# Patient Record
Sex: Male | Born: 1965 | Race: White | Hispanic: No | Marital: Married | Smoking: Never smoker
Health system: Southern US, Community
[De-identification: ages and names within clinical notes are randomized; demographics above are authoritative.]

---

## 2013-03-04 ENCOUNTER — Emergency Department (HOSPITAL_COMMUNITY)
Admission: EM | Admit: 2013-03-04 | Discharge: 2013-03-05 | Disposition: A | Discharge status: Home / Self Care | Payer: BC Managed Care – PPO | Attending: Emergency Medicine | Admitting: Emergency Medicine

## 2013-03-04 ENCOUNTER — Emergency Department (HOSPITAL_COMMUNITY): Payer: BC Managed Care – PPO

## 2013-03-04 ENCOUNTER — Encounter (HOSPITAL_COMMUNITY): Payer: Self-pay | Admitting: Emergency Medicine

## 2013-03-04 DIAGNOSIS — W010XXA Fall on same level from slipping, tripping and stumbling without subsequent striking against object, initial encounter: Secondary | ICD-10-CM | POA: Insufficient documentation

## 2013-03-04 DIAGNOSIS — Y929 Unspecified place or not applicable: Secondary | ICD-10-CM | POA: Insufficient documentation

## 2013-03-04 DIAGNOSIS — Y9301 Activity, walking, marching and hiking: Secondary | ICD-10-CM | POA: Insufficient documentation

## 2013-03-04 DIAGNOSIS — Z880 Allergy status to penicillin: Secondary | ICD-10-CM | POA: Insufficient documentation

## 2013-03-04 DIAGNOSIS — S43102A Unspecified dislocation of left acromioclavicular joint, initial encounter: Secondary | ICD-10-CM

## 2013-03-04 DIAGNOSIS — S43109A Unspecified dislocation of unspecified acromioclavicular joint, initial encounter: Secondary | ICD-10-CM | POA: Insufficient documentation

## 2013-03-04 NOTE — ED Notes (Signed)
Pt was walking and stumbled off the porch hitting his left shoulder on the ground. Shoulder has a contusion and pt applied ice. ROM WNL but shoulder is painful.

## 2013-03-05 MED ORDER — HYDROCODONE-ACETAMINOPHEN 5-325 MG PO TABS: ORAL_TABLET | ORAL | Status: DC

## 2013-03-05 MED ORDER — IBUPROFEN 800 MG PO TABS
800.0000 mg | ORAL_TABLET | Freq: Once | ORAL | Status: AC
  Administered 2013-03-05: 800 mg via ORAL
  Filled 2013-03-05: qty 1

## 2013-03-05 MED ORDER — HYDROCODONE-ACETAMINOPHEN 5-325 MG PO TABS
1.0000 | ORAL_TABLET | Freq: Once | ORAL | Status: AC
  Administered 2013-03-05: 1 via ORAL
  Filled 2013-03-05: qty 1

## 2013-03-05 NOTE — ED Provider Notes (Signed)
History    CSN: [id number] Arrival date & time 03/04/13  2318  First MD Initiated Contact with Patient 03/04/13 2347     Chief Complaint  Patient presents with  . Shoulder Injury   (Consider location/radiation/quality/duration/timing/severity/associated sxs/prior Treatment) HPI  Evan Schwartz is a 47 y.o. male complaining of left posterior shoulder pain after slip and fall while patient was sitting on the porch this evening. Patient denies head trauma, LOC, headache, nausea vomiting, cervicalgia, numbness, paresthesia, chest pain, shortness of breath abdominal pain difficulty ambulating. He reports the pain at  History reviewed. No pertinent past medical history. History reviewed. No pertinent past surgical history. History reviewed. No pertinent family history. History  Substance Use Topics  . Smoking status: Never Smoker   . Smokeless tobacco: Never Used  . Alcohol Use: Yes     Comment: socially    Review of Systems  Constitutional:       Negative except as described in HPI  HENT:       Negative except as described in HPI  Respiratory:       Negative except as described in HPI  Cardiovascular:       Negative except as described in HPI  Gastrointestinal:       Negative except as described in HPI  Genitourinary:       Negative except as described in HPI  Musculoskeletal:       Negative except as described in HPI  Skin:       Negative except as described in HPI  Neurological:       Negative except as described in HPI  All other systems reviewed and are negative.    Allergies  Penicillins  Home Medications  No current outpatient prescriptions on file. BP 112/69  Pulse 84  Temp(Src) 98.1 F (36.7 C) (Oral)  Resp 15  Ht 6\' 1"  (1.854 m)  Wt 168 lb (76.204 kg)  BMI 22.17 kg/m2  SpO2 98% Physical Exam  Nursing note and vitals reviewed. Constitutional: He is oriented to person, place, and time. He appears well-developed and well-nourished. No distress.   HENT:  Head: Normocephalic and atraumatic.  Mouth/Throat: Oropharynx is clear and moist.  Eyes: Conjunctivae and EOM are normal. Pupils are equal, round, and reactive to light.  Neck: Normal range of motion.  No midline tenderness to palpation or step-offs appreciated. Patient has full range of motion without pain.   Cardiovascular: Normal rate, regular rhythm and intact distal pulses.   Pulmonary/Chest: Effort normal and breath sounds normal. No stridor. No respiratory distress. He has no wheezes. He has no rales. He exhibits no tenderness.  Abdominal: Soft. Bowel sounds are normal. He exhibits no distension and no mass. There is no tenderness. There is no rebound and no guarding.  Musculoskeletal: He exhibits tenderness. He exhibits no edema.  No clavicular deformities, does not appear to be dislocated. Can abduct greater than 90, drop arm is negative, there is no tenderness to palpation along the rotator cuff musculature. Neurovascularly intact. Elbow shows no abnormalities patient can fully extend, there is no snuffbox tenderness.  Neurological: He is alert and oriented to person, place, and time.  Psychiatric: He has a normal mood and affect.    ED Course  Procedures (including critical care time) Labs Reviewed - No data to display Dg Shoulder Left  03/05/2013   *RADIOLOGY REPORT*  Clinical Data: Larey Seat from [employer].  Pain and stiffness in the left shoulder.  Raised area over the distal clavicle.  LEFT SHOULDER - 2+ VIEW  Comparison: None.  Findings: There is grade II acromioclavicular separation consistent with ligamentous injury.  No acute fractures are demonstrated.  No dislocation of the glenohumeral joint.  No focal bone lesion or bone destruction.  Bone cortex and trabecular architecture appear intact.  IMPRESSION: Grade II acromioclavicular separation without fracture.   Original Report Authenticated By: Burman Nieves, M.D.   1. AC separation, left, initial encounter      MDM   Filed Vitals:   03/04/13 2332  BP: 112/69  Pulse: 84  Temp: 98.1 F (36.7 C)  TempSrc: Oral  Resp: 15  Height: 6\' 1"  (1.854 m)  Weight: 168 lb (76.204 kg)  SpO2: 98%     YONY ROULSTON is a 47 y.o. male with grade 2 a.c. separation. Patient will be given a sling and ortho followup.  Medications  ibuprofen (ADVIL,MOTRIN) tablet 800 mg (800 mg Oral Given 03/05/13 0049)  HYDROcodone-acetaminophen (NORCO/VICODIN) 5-325 MG per tablet 1 tablet (1 tablet Oral Given 03/05/13 0049)    Pt is hemodynamically stable, appropriate for, and amenable to discharge at this time. Pt verbalized understanding and agrees with care plan. Outpatient follow-up and specific return precautions discussed.    Discharge Medication List as of 03/05/2013 12:44 AM    START taking these medications   Details  HYDROcodone-acetaminophen (NORCO/VICODIN) 5-325 MG per tablet Take 1-2 tablets by mouth every 6 hours as needed for pain., [employer], PA-C 03/06/13 2253

## 2013-03-09 NOTE — ED Provider Notes (Signed)
Medical screening examination/treatment/procedure(s) were performed by non-physician practitioner and as supervising physician I was immediately available for consultation/collaboration.  Jasmine Awe, MD 03/09/13 2322

## 2018-03-23 ENCOUNTER — Emergency Department (HOSPITAL_COMMUNITY)
Admission: EM | Admit: 2018-03-23 | Discharge: 2018-03-23 | Disposition: A | Discharge status: Home / Self Care | Payer: BLUE CROSS/BLUE SHIELD | Attending: Emergency Medicine | Admitting: Emergency Medicine

## 2018-03-23 ENCOUNTER — Other Ambulatory Visit: Payer: Self-pay

## 2018-03-23 ENCOUNTER — Encounter (HOSPITAL_COMMUNITY): Payer: Self-pay | Admitting: *Deleted

## 2018-03-23 DIAGNOSIS — Z79899 Other long term (current) drug therapy: Secondary | ICD-10-CM | POA: Diagnosis not present

## 2018-03-23 DIAGNOSIS — M5432 Sciatica, left side: Secondary | ICD-10-CM | POA: Insufficient documentation

## 2018-03-23 DIAGNOSIS — M545 Low back pain: Secondary | ICD-10-CM | POA: Diagnosis present

## 2018-03-23 MED ORDER — DICLOFENAC 18 MG PO CAPS: 18.0000 mg | ORAL_CAPSULE | Freq: Three times a day (TID) | ORAL | 0 refills | Status: AC

## 2018-03-23 MED ORDER — METHOCARBAMOL 500 MG PO TABS: 500.0000 mg | ORAL_TABLET | Freq: Two times a day (BID) | ORAL | 0 refills | Status: AC

## 2018-03-23 MED ORDER — KETOROLAC TROMETHAMINE 30 MG/ML IJ SOLN
15.0000 mg | Freq: Once | INTRAMUSCULAR | Status: AC
  Administered 2018-03-23: 15 mg via INTRAVENOUS
  Filled 2018-03-23: qty 1

## 2018-03-23 MED ORDER — LIDOCAINE 5 % EX PTCH: 1.0000 | MEDICATED_PATCH | CUTANEOUS | 0 refills | Status: DC

## 2018-03-23 MED ORDER — MORPHINE SULFATE (PF) 4 MG/ML IV SOLN
4.0000 mg | Freq: Once | INTRAVENOUS | Status: AC
Start: 2018-03-23 — End: 2018-03-23
  Administered 2018-03-23: 4 mg via INTRAVENOUS
  Filled 2018-03-23: qty 1

## 2018-03-23 MED ORDER — HYDROCODONE-ACETAMINOPHEN 5-325 MG PO TABS: 1.0000 | ORAL_TABLET | Freq: Four times a day (QID) | ORAL | 0 refills | Status: DC | PRN

## 2018-03-23 MED ORDER — PREDNISONE 10 MG (21) PO TBPK: ORAL_TABLET | ORAL | 0 refills | Status: DC

## 2018-03-23 MED ORDER — PREDNISONE 10 MG (21) PO TBPK: ORAL_TABLET | ORAL | 0 refills | Status: AC

## 2018-03-23 MED ORDER — ONDANSETRON HCL 4 MG/2ML IJ SOLN
4.0000 mg | Freq: Once | INTRAMUSCULAR | Status: AC
  Administered 2018-03-23: 4 mg via INTRAVENOUS
  Filled 2018-03-23: qty 2

## 2018-03-23 MED ORDER — HYDROCODONE-ACETAMINOPHEN 5-325 MG PO TABS
1.0000 | ORAL_TABLET | Freq: Once | ORAL | Status: AC
  Administered 2018-03-23: 1 via ORAL
  Filled 2018-03-23: qty 1

## 2018-03-23 MED ORDER — METHOCARBAMOL 500 MG PO TABS
1000.0000 mg | ORAL_TABLET | Freq: Once | ORAL | Status: AC
  Administered 2018-03-23: 1000 mg via ORAL
  Filled 2018-03-23: qty 2

## 2018-03-23 MED ORDER — HYDROMORPHONE HCL 1 MG/ML IJ SOLN
1.0000 mg | Freq: Once | INTRAMUSCULAR | Status: AC
  Administered 2018-03-23: 1 mg via INTRAMUSCULAR
  Filled 2018-03-23: qty 1

## 2018-03-23 MED ORDER — PREDNISONE 20 MG PO TABS
60.0000 mg | ORAL_TABLET | Freq: Once | ORAL | Status: AC
  Administered 2018-03-23: 60 mg via ORAL
  Filled 2018-03-23: qty 3

## 2018-03-23 NOTE — ED Notes (Signed)
Pt is able to ambulate without any issues or devices for assistance. Pt is now ready and has been approved for discharge by ED Provider.

## 2018-03-23 NOTE — ED Notes (Signed)
Pt wants to see ED Provider before leaving regarding pain. ED provider made aware.

## 2018-03-23 NOTE — Discharge Instructions (Addendum)
Expect your soreness to increase over the next 2-3 days. Take it easy, but do not lay around too much as this may make any stiffness worse.  Antiinflammatory medications: Take 600 mg of ibuprofen every 6 hours or 440 mg (over the counter dose) to 500 mg (prescription dose) of naproxen every 12 hours for the next 3 days. After this time, these medications may be used as needed for pain. Take these medications with food to avoid upset stomach. Choose only one of these medications, do not take them together.  Diclofenac: As an alternative to the ibuprofen or naproxen, may use the diclofenac.  Do not use these medications together.  Take this medication with food to avoid upset stomach. Tylenol: Should you continue to have additional pain while taking the ibuprofen or naproxen, you may add in tylenol as needed. Your daily total maximum amount of tylenol from all sources should be limited to 4000mg /day for persons without liver problems, or 2000mg /day for those with liver problems. Vicodin: May take Vicodin as needed for severe pain.  Do not drive or perform other dangerous activities while taking the Vicodin.  Please note that each pill of Vicodin contains 325 mg of Tylenol and the above dosage limits apply. Muscle relaxer: Robaxin is a muscle relaxer and may help loosen stiff muscles. Do not take the Robaxin while driving or performing other dangerous activities.  Lidocaine patches: These are available via either prescription or over-the-counter. The over-the-counter option may be more economical one and are likely just as effective. There are multiple over-the-counter brands, such as Salonpas. Prednisone: Take the prednisone, as prescribed, until finished. Exercises: Be sure to perform the attached exercises starting with three times a week and working up to performing them daily. This is an essential part of preventing long term problems.  Follow up: Follow up with a primary care provider or the orthopedic  specialist for any future management of these complaints. Be sure to follow up within 7-10 days. Return: Return to the ED should you begin to have muscle weakness, worsening symptoms, changes in bowel or bladder function, numbness in the groin, or any other major concerns.

## 2018-03-23 NOTE — ED Notes (Signed)
ED Provider at bedside. 

## 2018-03-23 NOTE — ED Provider Notes (Addendum)
[facility] Provider Note   CSN: [id number] Arrival date & time: 03/23/18  2725     History   Chief Complaint Chief Complaint  Patient presents with  . Back Pain    HPI Evan Schwartz is a 52 y.o. male.  HPI   Evan Schwartz is a 52 y.o. male, presenting to the ED with left lower back pain.  States he has had intermittent pain in the left buttocks for the past several months and this was improving over time with exercises and ibuprofen.  Last week, he helped move a heavy object and began to feel increased pain in the left lower back, worse in the morning with stiffness and tightness, with pain shooting into the back of the left leg, currently 8/10. Accompanied by some tingling in the left foot. Denies falls/trauma, changes in bowel or bladder function, saddle anesthesias, weakness, numbness, abdominal pain, or any other complaints.     History reviewed. No pertinent past medical history.  There are no active problems to display for this patient.   History reviewed. No pertinent surgical history.      Home Medications    Prior to Admission medications   Medication Sig Start Date End Date Taking? Authorizing Provider  ibuprofen (ADVIL,MOTRIN) 200 MG tablet Take 200 mg by mouth every 6 (six) hours as needed for moderate pain.   Yes [provider]  Diclofenac 18 MG CAPS Take 18 mg by mouth 3 (three) times daily. 03/23/18   Joy, Shawn C, PA-C  HYDROcodone-acetaminophen (NORCO/VICODIN) 5-325 MG tablet Take 1-2 tablets by mouth every 6 (six) hours as needed for severe pain. 03/23/18   Joy, Shawn C, PA-C  lidocaine (LIDODERM) 5 % Place 1 patch onto the skin daily. Remove & Discard patch within 12 hours or as directed by MD 03/23/18   Joy, Shawn C, PA-C  methocarbamol (ROBAXIN) 500 MG tablet Take 1 tablet (500 mg total) by mouth 2 (two) times daily. 03/23/18   Joy, Shawn C, PA-C  predniSONE (STERAPRED UNI-PAK 21 TAB) 10 MG (21) TBPK tablet  Take 6 tabs (60mg ) on day 1, 5 tabs (50mg ) on day 2, 4 tabs (40mg ) on day 3, 3 tabs (30mg ) on day 4, 2 tabs (20mg ) on day 5, and 1 tab (10mg ) on day 6. 03/23/18   Joy, Hillard Danker, PA-C    Family History No family history on file.  Social History Social History   Tobacco Use  . Smoking status: Never Smoker  . Smokeless tobacco: Never Used  Substance Use Topics  . Alcohol use: Yes    Comment: socially  . Drug use: No     Allergies   Penicillins   Review of Systems Review of Systems  Constitutional: Negative for chills and fever.  Respiratory: Negative for shortness of breath.   Gastrointestinal: Negative for abdominal pain, nausea and vomiting.  Genitourinary: Negative for difficulty urinating.  Musculoskeletal: Positive for back pain.  Neurological: Negative for weakness and numbness.  All other systems reviewed and are negative.    Physical Exam Updated Vital Signs BP 121/87   Pulse 76   Temp 97.9 F (36.6 C) (Oral)   Resp 16   Ht 6\' 1"  (1.854 m)   SpO2 98%   BMI 22.16 kg/m   Physical Exam  Constitutional: He appears well-developed and well-nourished. No distress.  HENT:  Head: Normocephalic and atraumatic.  Eyes: Conjunctivae are normal.  Neck: Neck supple.  Cardiovascular: Normal rate, regular rhythm and intact  distal pulses.  Pulmonary/Chest: Effort normal. No respiratory distress.  Abdominal: There is no guarding.  Musculoskeletal: He exhibits tenderness. He exhibits no edema.       Back:  Normal motor function intact in all extremities and spine. No midline spinal tenderness.   Lymphadenopathy:    He has no cervical adenopathy.  Neurological: He is alert.  Sensation grossly intact to the lower extremities bilaterally. No saddle anesthesias. Strength 5/5 with flexion and extension at the bilateral hips, knees, and ankles. No noted gait deficit. Coordination intact with heel to shin testing.  Skin: Skin is warm and dry. He is not diaphoretic.    Psychiatric: He has a normal mood and affect. His behavior is normal.  Nursing note and vitals reviewed.    ED Treatments / Results  Labs (all labs ordered are listed, but only abnormal results are displayed) Labs Reviewed - No data to display  EKG None  Radiology No results found.  Procedures Procedures (including critical care time)  Medications Ordered in ED Medications  HYDROcodone-acetaminophen (NORCO/VICODIN) 5-325 MG per tablet 1 tablet (1 tablet Oral Given 03/23/18 0835)  methocarbamol (ROBAXIN) tablet 1,000 mg (1,000 mg Oral Given 03/23/18 0835)  predniSONE (DELTASONE) tablet 60 mg (60 mg Oral Given 03/23/18 0835)  morphine 4 MG/ML injection 4 mg (4 mg Intravenous Given 03/23/18 0951)  ondansetron (ZOFRAN) injection 4 mg (4 mg Intravenous Given 03/23/18 0954)  ketorolac (TORADOL) 30 MG/ML injection 15 mg (15 mg Intravenous Given 03/23/18 0954)  HYDROmorphone (DILAUDID) injection 1 mg (1 mg Intramuscular Given 03/23/18 1156)     Initial Impression / Assessment and Plan / ED Course  I have reviewed the triage vital signs and the nursing notes.  Pertinent labs & imaging results that were available during my care of the patient were reviewed by me and considered in my medical decision making (see chart for details).  Clinical Course as of Mar 24 1231  Tue Mar 23, 2018  0904 Patient voices improvement in his pain.   [SJ]  0930 Patient having more pain, states he can not move from a sitting position due to pain.  Reexamined patient.  Muscle in the left buttocks feels as though it is tight and spasming.   [SJ]  1045 Patient states he feels better. He was able to get to his feet, but then had pain shooting down his left leg and requested to lay back down. No neuro deficits.    [SJ]  1220 Patient states he feels much better.  He was able to stand and ambulate without assistance.  States he is ready for discharge.   [SJ]    Clinical Course User Index [SJ] Joy, Shawn C, PA-C     Patient presents with symptoms suggestive of sciatica.  Neurovascularly intact.  Improvement here in the ED. The patient was given instructions for home care as well as return precautions. Patient voices understanding of these instructions, accepts the plan, and is comfortable with discharge.  Findings and plan of care discussed with Mancel Bale, MD.   Vitals:   03/23/18 1100 03/23/18 1128 03/23/18 1130 03/23/18 1200  BP: (!) 108/92 (!) 108/92 120/82 134/85  Pulse: 70 78 80 71  Resp:  15  16  Temp:      TempSrc:      SpO2: 95% 97% 96% 98%  Height:         Final Clinical Impressions(s) / ED Diagnoses   Final diagnoses:  Sciatica of left side    ED Discharge  Orders        Ordered    HYDROcodone-acetaminophen (NORCO/VICODIN) 5-325 MG tablet  Every 6 hours PRN     03/23/18 0832    methocarbamol (ROBAXIN) 500 MG tablet  2 times daily     03/23/18 0832    lidocaine (LIDODERM) 5 %  Every 24 hours     03/23/18 0832    Diclofenac 18 MG CAPS  3 times daily     03/23/18 0832    predniSONE (STERAPRED UNI-PAK 21 TAB) 10 MG (21) TBPK tablet  Status:  Discontinued     03/23/18 0842    predniSONE (STERAPRED UNI-PAK 21 TAB) 10 MG (21) TBPK tablet     03/23/18 0842       Anselm PancoastJoy, Shawn C, PA-C 03/23/18 0920    Anselm PancoastJoy, Shawn C, PA-C 03/23/18 1233    Mancel BaleWentz, Elliott, MD 03/23/18 2028

## 2018-03-23 NOTE — ED Triage Notes (Signed)
Pt arrives via EMS from home with c/o left back pain that radiates down his left leg, with some feeling of numbness in the left toes and calf area. Pt says that he did lift up a grill into the back of a truck about a week ago. Pain worse this morning when he said he stood up and then was unable to move d/t pain after that. Has been taking Ibuprofen for the pain.

## 2018-03-23 NOTE — ED Notes (Signed)
Bed: [id number] Expected date:  Expected time:  Means of arrival:  Comments: EMS 52 yo male back pain radiating to left leg

## 2018-03-31 ENCOUNTER — Ambulatory Visit (INDEPENDENT_AMBULATORY_CARE_PROVIDER_SITE_OTHER): Payer: Self-pay

## 2018-03-31 ENCOUNTER — Ambulatory Visit (INDEPENDENT_AMBULATORY_CARE_PROVIDER_SITE_OTHER): Payer: BLUE CROSS/BLUE SHIELD | Admitting: Orthopaedic Surgery

## 2018-03-31 ENCOUNTER — Encounter (INDEPENDENT_AMBULATORY_CARE_PROVIDER_SITE_OTHER): Payer: Self-pay | Admitting: Orthopaedic Surgery

## 2018-03-31 DIAGNOSIS — M545 Low back pain, unspecified: Secondary | ICD-10-CM | POA: Insufficient documentation

## 2018-03-31 MED ORDER — CYCLOBENZAPRINE HCL 10 MG PO TABS: ORAL_TABLET | ORAL | 0 refills | Status: AC

## 2018-03-31 MED ORDER — MELOXICAM 7.5 MG PO TABS: ORAL_TABLET | ORAL | 2 refills | Status: DC

## 2018-03-31 NOTE — Progress Notes (Signed)
Office Visit Note   Patient: Evan Schwartz           Date of Birth: October 03, 1965           MRN: [id number] Visit Date: 03/31/2018              Requested by: Gweneth Dimitri, MD [address] [place], [place] [address] PCP: Gweneth Dimitri, MD   Assessment & Plan: Visit Diagnoses:  1. Acute low back pain, unspecified back pain laterality, with sciatica presence unspecified     Plan: Impression is left-sided lumbar radiculopathy.  At this point, will obtain an MRI to further assess this due to his significant tingling to the left foot.  He will follow-up with [place] once that has been completed.  In the meantime, he will continue with NSAIDs and muscle relaxer.  New prescriptions were called in today.  Follow-Up Instructions: Return in about 2 weeks (around 04/14/2018) for mri review.   Orders:  Orders Placed This Encounter  Procedures  . XR Lumbar Spine 2-3 Views  . MR Lumbar Spine w/o contrast   Meds ordered this encounter  Medications  . meloxicam (MOBIC) 7.5 MG tablet    Sig: Take one tab po qd x 2 weeks and then prn    Dispense:  30 tablet    Refill:  2  . cyclobenzaprine (FLEXERIL) 10 MG tablet    Sig: Take one tab po bid prn muscle spasm    Dispense:  30 tablet    Refill:  0      Procedures: No procedures performed   Clinical Data: No additional findings.   Subjective: Chief Complaint  Patient presents with  . Lower Back - Pain    HPI patient is a 52 year old gentleman who presents to our clinic today for follow-up of lower back pain.  A little over a week ago, he was moving a grill when he had increased pain to his left buttocks.  The pain was tolerable but when he woke up last Tuesday morning, he was unable to stand.  He had such significant pain that he had to call EMS.  He was taken to the ED for this.  He was given prednisone as well as a muscle relaxer.  He does note improvement of pain.  The pain he does have is to the left buttocks radiating down the  entire left leg and into the foot.  He notes constant tingling to the entire foot.  Pain is worse with activity and sleeping.  He has finished his medication given to him from the ED and has been on NSAIDs.  He has been seen by his chiropractor as well.  He denies any bowel or bladder change or saddle paresthesias.  Review of Systems as detailed in HPI.  All others reviewed and are negative.   Objective: Vital Signs: There were no vitals taken for this visit.  Physical Exam well-developed well-nourished gentleman no acute distress.  Alert and oriented x3.  Ortho Exam examination of his lumbar spine reveals full lumbar flexion and extension without pain.  No spinous or paraspinous tenderness.  Negative logroll.  Markedly positive straight leg raise on the left.  EHL and FHL intact.  Full sensation distally.  Specialty Comments:  No specialty comments available.  Imaging: Xr Lumbar Spine 2-3 Views  Result Date: 03/31/2018 Abnormal straightening lower lumbar spine    PMFS History: Patient Active Problem List   Diagnosis Date Noted  . Acute low back pain 03/31/2018  History reviewed. No pertinent past medical history.  History reviewed. No pertinent family history.  History reviewed. No pertinent surgical history. Social History   Occupational History  . Not on file  Tobacco Use  . Smoking status: Never Smoker  . Smokeless tobacco: Never Used  Substance and Sexual Activity  . Alcohol use: Yes    Comment: socially  . Drug use: No  . Sexual activity: Not on file

## 2018-04-12 ENCOUNTER — Ambulatory Visit
Admission: RE | Admit: 2018-04-12 | Discharge: 2018-04-12 | Disposition: A | Discharge status: Home / Self Care | Payer: BLUE CROSS/BLUE SHIELD | Source: Ambulatory Visit | Attending: Orthopaedic Surgery | Admitting: Orthopaedic Surgery

## 2018-04-12 DIAGNOSIS — M545 Low back pain: Secondary | ICD-10-CM

## 2018-04-14 ENCOUNTER — Encounter (INDEPENDENT_AMBULATORY_CARE_PROVIDER_SITE_OTHER): Payer: Self-pay | Admitting: Orthopaedic Surgery

## 2018-04-14 ENCOUNTER — Ambulatory Visit (INDEPENDENT_AMBULATORY_CARE_PROVIDER_SITE_OTHER): Payer: BLUE CROSS/BLUE SHIELD | Admitting: Physician Assistant

## 2018-04-14 DIAGNOSIS — M5442 Lumbago with sciatica, left side: Secondary | ICD-10-CM

## 2018-04-14 NOTE — Progress Notes (Signed)
   Post-Op Visit Note   Patient: Evan HedgesFrank J Schwartz           Date of Birth: [birth year]           MRN: [id number] Visit Date: 04/14/2018 PCP: Gweneth DimitriMcNeill, Wendy, MD   Assessment & Plan:  Chief Complaint:  Chief Complaint  Patient presents with  . Lower Back - Pain, Follow-up   Visit Diagnoses:  1. Acute low back pain with left-sided sciatica, unspecified back pain laterality     Plan: Patient is a pleasant 52 year old gentleman presents our clinic today to discuss MRI results of his lumbar spine.  He has had lower back pain with left lower extremity radiculopathy for the past year.  He noticed increased symptoms with significant tingling to the left foot a few weeks back when he was moving a grill.  Pain is manageable but with anti-inflammatories.  No weakness.  No bowel or bladder change and no saddle paresthesias.  Examination of his left lower extremity reveals full range of motion and strength throughout.  No focal weakness.  EHL FHL intact.  At this point, we have discussed proceeding with an epidural steroid injection versus neurosurgery referral.  He is opted to proceed with referral to neuro.  We will set this up.  He will continue with his oral anti-inflammatories as needed.  He will follow-up with us as needed.  Follow-Up Instructions: Return if symptoms worsen or fail to improve.   Orders:  Orders Placed This Encounter  Procedures  . Ambulatory referral to Neurosurgery   No orders of the defined types were placed in this encounter.   Imaging: No results found.  PMFS History: Patient Active Problem List   Diagnosis Date Noted  . Acute low back pain 03/31/2018   History reviewed. No pertinent past medical history.  History reviewed. No pertinent family history.  History reviewed. No pertinent surgical history. Social History   Occupational History  . Not on file  Tobacco Use  . Smoking status: Never Smoker  . Smokeless tobacco: Never Used  Substance and Sexual Activity   . Alcohol use: Yes    Comment: socially  . Drug use: No  . Sexual activity: Not on file

## 2018-04-20 ENCOUNTER — Telehealth (INDEPENDENT_AMBULATORY_CARE_PROVIDER_SITE_OTHER): Payer: Self-pay | Admitting: Orthopaedic Surgery

## 2018-04-20 NOTE — Telephone Encounter (Signed)
Not sure who faxed first fax but I called [facility] and got a fax number and was faxed. Fax number:[phone]  Patient aware.

## 2018-04-20 NOTE — Telephone Encounter (Signed)
Patient states [facility] has not received MRI/ report from us. Can we resend that over? Patient has an appointment tomorrow. He would like a call once this has been done # [phone]

## 2018-04-21 NOTE — Telephone Encounter (Signed)
Patient is now saying [facility] has not received referral? Can you please fax this again to fax # [phone] attn: Nedra HaiLee

## 2018-04-21 NOTE — Telephone Encounter (Signed)
DONE

## 2018-08-28 ENCOUNTER — Other Ambulatory Visit: Payer: Self-pay

## 2018-08-28 ENCOUNTER — Emergency Department (HOSPITAL_COMMUNITY)
Admission: EM | Admit: 2018-08-28 | Discharge: 2018-08-28 | Disposition: A | Discharge status: Home / Self Care | Payer: BLUE CROSS/BLUE SHIELD | Attending: Emergency Medicine | Admitting: Emergency Medicine

## 2018-08-28 DIAGNOSIS — Z711 Person with feared health complaint in whom no diagnosis is made: Secondary | ICD-10-CM | POA: Diagnosis present

## 2018-08-28 LAB — RAPID HIV SCREEN (HIV 1/2 AB+AG)
HIV 1/2 Antibodies: NONREACTIVE
HIV-1 P24 ANTIGEN - HIV24: NONREACTIVE

## 2018-08-28 MED ORDER — ELVITEG-COBIC-EMTRICIT-TENOFAF 150-150-200-10 MG PO TABS
1.0000 | ORAL_TABLET | Freq: Once | ORAL | Status: AC
  Administered 2018-08-28: 1 via ORAL
  Filled 2018-08-28: qty 1

## 2018-08-28 MED ORDER — ELVITEG-COBIC-EMTRICIT-TENOFAF 150-150-200-10 MG PO TABS: 1.0000 | ORAL_TABLET | Freq: Every day | ORAL | 0 refills | Status: AC

## 2018-08-28 MED ORDER — ELVITEG-COBIC-EMTRICIT-TENOFAF 150-150-200-10 MG PREPACK
5.0000 | ORAL_TABLET | Freq: Once | ORAL | Status: AC
  Administered 2018-08-28: 5 via ORAL

## 2018-08-28 NOTE — ED Triage Notes (Signed)
Pt endorses possibly being exposed to HIV via sexual intercourse last night and wants treatment for possbile exposure. VSS, NAD.

## 2018-08-28 NOTE — ED Notes (Signed)
Pt states he was not able to void for sample, states he no longer wants the test and would like to go home. Patient verbalizes understanding of discharge instructions. Opportunity for questioning and answers were provided. Armband removed by staff, pt discharged from ED.

## 2018-08-28 NOTE — Discharge Instructions (Addendum)
Please read the attached information regarding follow-up.

## 2018-08-28 NOTE — ED Provider Notes (Signed)
[facility]ENCY [facility] Provider Note   CSN: [id number] Arrival date & time: 08/28/18  1304     History   Chief Complaint Chief Complaint  Patient presents with  . Exposure to STD    HPI Evan Schwartz is a 52 y.o. male who presents to ED for HIV exposure that occurred last night.  Patient engaged in oral intercourse with a partner who he was told was HIV positive although undetectable.  Patient would like to be treated for possible exposure.  He had a negative HIV test in August 2019.  Denies any other complaints.  HPI  No past medical history on file.  Patient Active Problem List   Diagnosis Date Noted  . Acute low back pain 03/31/2018    No past surgical history on file.      Home Medications    Prior to Admission medications   Medication Sig Start Date End Date Taking? Authorizing Provider  cyclobenzaprine (FLEXERIL) 10 MG tablet Take one tab po bid prn muscle spasm 03/31/18   Cristie HemStanbery, Mary L, PA-C  Diclofenac 18 MG CAPS Take 18 mg by mouth 3 (three) times daily. Patient not taking: Reported on 03/31/2018 03/23/18   Anselm PancoastJoy, Shawn C, PA-C  elvitegravir-cobicistat-emtricitabine-tenofovir (GENVOYA) 150-150-200-10 MG TABS tablet Take 1 tablet by mouth daily with breakfast. 08/28/18   Sashay Felling, Hillary BowHina, PA-C  HYDROcodone-acetaminophen (NORCO/VICODIN) 5-325 MG tablet Take 1-2 tablets by mouth every 6 (six) hours as needed for severe pain. Patient not taking: Reported on 03/31/2018 03/23/18   Anselm PancoastJoy, Shawn C, PA-C  ibuprofen (ADVIL,MOTRIN) 200 MG tablet Take 200 mg by mouth every 6 (six) hours as needed for moderate pain.    [provider]  lidocaine (LIDODERM) 5 % Place 1 patch onto the skin daily. Remove & Discard patch within 12 hours or as directed by MD Patient not taking: Reported on 03/31/2018 03/23/18   Anselm PancoastJoy, Shawn C, PA-C  meloxicam (MOBIC) 7.5 MG tablet Take one tab po qd x 2 weeks and then prn 03/31/18   Cristie HemStanbery, Mary L, PA-C  methocarbamol  (ROBAXIN) 500 MG tablet Take 1 tablet (500 mg total) by mouth 2 (two) times daily. Patient not taking: Reported on 03/31/2018 03/23/18   Joy, Hillard DankerShawn C, PA-C  predniSONE (STERAPRED UNI-PAK 21 TAB) 10 MG (21) TBPK tablet Take 6 tabs (60mg ) on day 1, 5 tabs (50mg ) on day 2, 4 tabs (40mg ) on day 3, 3 tabs (30mg ) on day 4, 2 tabs (20mg ) on day 5, and 1 tab (10mg ) on day 6. Patient not taking: Reported on 03/31/2018 03/23/18   Anselm PancoastJoy, Shawn C, PA-C    Family History No family history on file.  Social History Social History   Tobacco Use  . Smoking status: Never Smoker  . Smokeless tobacco: Never Used  Substance Use Topics  . Alcohol use: Yes    Comment: socially  . Drug use: No     Allergies   Penicillins   Review of Systems Review of Systems  Constitutional: Negative for chills and fever.  Genitourinary: Negative for discharge, dysuria and scrotal swelling.  Skin: Negative for rash.     Physical Exam Updated Vital Signs BP 123/85 (BP Location: Right Arm)   Pulse 83   Temp 98 F (36.7 C) (Oral)   Resp 17   SpO2 95%   Physical Exam Vitals signs and nursing note reviewed.  Constitutional:      General: He is not in acute distress.    Appearance: He is  well-developed. He is not diaphoretic.  HENT:     Head: Normocephalic and atraumatic.  Eyes:     General: No scleral icterus.    Conjunctiva/sclera: Conjunctivae normal.  Neck:     Musculoskeletal: Normal range of motion.  Pulmonary:     Effort: Pulmonary effort is normal. No respiratory distress.  Skin:    Findings: No rash.  Neurological:     Mental Status: He is alert.      ED Treatments / Results  Labs (all labs ordered are listed, but only abnormal results are displayed) Labs Reviewed  RAPID HIV SCREEN (HIV 1/2 AB+AG)  HEPATITIS C ANTIBODY  HEPATITIS B SURFACE ANTIGEN  RPR  GC/CHLAMYDIA PROBE AMP ([place]) [facility]    EKG None  Radiology No results found.  Procedures Procedures (including  critical care time)  Medications Ordered in ED Medications  elvitegravir-cobicistat-emtricitabine-tenofovir (GENVOYA) 150-150-200-10 Prepack 5 tablet (5 tablets Oral Provided for home use 08/28/18 1405)  elvitegravir-cobicistat-emtricitabine-tenofovir (GENVOYA) 150-150-200-10 MG tablet 1 tablet (1 tablet Oral Given 08/28/18 1437)     Initial Impression / Assessment and Plan / ED Course  I have reviewed the triage vital signs and the nursing notes.  Pertinent labs & imaging results that were available during my care of the patient were reviewed by me and considered in my medical decision making (see chart for details).     52 year old male presents to ED for HIV exposure last night.  Denies any complaints.  Case management spoke to the patient and he was given a 5-day supply of Genvoya.  Given a prescription for Genvoya as well as follow-up information regarding remainder of prescription and retesting.  Patient tested for HIV, RPR, GC chlamydia here prior to discharge.  Patient is agreeable to the plan.  Advised to return to ED for any severe worsening symptoms. He does not want to wait for results of rapid HIV screen.  Patient is hemodynamically stable, in NAD, and able to ambulate in the ED. Evaluation does not show pathology that would require ongoing emergent intervention or inpatient treatment. I explained the diagnosis to the patient. Pain has been managed and has no complaints prior to discharge. Patient is comfortable with above plan and is stable for discharge at this time. All questions were answered prior to disposition. Strict return precautions for returning to the ED were discussed. Encouraged follow up with PCP.    Portions of this note were generated with Scientist, clinical (histocompatibility and immunogenetics)Dragon dictation software. Dictation errors may occur despite best attempts at proofreading.  Final Clinical Impressions(s) / ED Diagnoses   Final diagnoses:  Concern about STD in male without diagnosis    ED Discharge  Orders         Ordered    elvitegravir-cobicistat-emtricitabine-tenofovir (GENVOYA) 150-150-200-10 MG TABS tablet  Daily with breakfast     08/28/18 1350           Dietrich PatesKhatri, Marilu Rylander, PA-C 08/28/18 1444    Gwyneth SproutPlunkett, Whitney, MD 08/29/18 1308

## 2018-08-29 LAB — HEPATITIS B SURFACE ANTIGEN: HEP B S AG: NEGATIVE

## 2018-08-29 LAB — RPR: RPR Ser Ql: NONREACTIVE

## 2018-08-29 LAB — HEPATITIS C ANTIBODY

## 2018-09-02 ENCOUNTER — Telehealth: Payer: Self-pay | Admitting: Emergency Medicine

## 2018-09-02 NOTE — Telephone Encounter (Signed)
This note is coped from pt's spouses chart.  Both had the same prescription CM needs and this CM only spoke with pt's spouse.    CM received a phone call from pt regarding a prescription from [facility] ED encounter 08/28/2018.  elvitegravir-cobicistat-emtricitabine-tenofovir (GENVOYA) 150-150-200-10 MG TABS tablet  Sig: Take 1 tablet by mouth daily with breakfast.    Start: 08/28/18    Quantity: 30 tablet Refills: 0     He advised that he nor his spouse Rishard Gottsch, [place] Mar 06, 1966) have the AVS paperwork with the printed prescriptions on them from time of D/C. He advised he will look again at home later today.  He also advised they took the last of the 5 days they were given this morning.  CM contacted [facility] ED to discover if the paperwork was sitting at [facility].  Paper work was not there.    CM then contacted pt and spouse's primary pharmacy [facility] with prescription information.  They advised they would not be able to get the medication in until next week.    CM contacted [facility] on [address] where CM knows they carry stock of the medication. Prescription information provided for both and he his spouse to pharmacist.  CM contacted Mr. Raul Del again to advise of [facility] pharmacy location and contact information and reason for changing to another pharmacy from their preferred.  Mr. Raul Del was thankful and advised his spouse would pick it up later today.  No further CM needs noted at this time.

## 2018-09-19 IMAGING — MR MR LUMBAR SPINE W/O CM
4 of 5 series · 17 of 48 positions shown · non-contrast
Comparison: Lumbar radiographs 03/31/2018.

CLINICAL DATA: 51-year-old male with recent onset lumbar back pain,
progressive with left side pain radiating down the left leg the foot
for a couple of months.

EXAM:
MRI LUMBAR SPINE WITHOUT CONTRAST
TECHNIQUE: Multiplanar, multisequence MR imaging of the lumbar spine was
performed. No intravenous contrast was administered.

[Series 5: T2 · sagittal · 4.0mm · 0.73mm/px · 6 of 15 slices shown (1 of 2)]
[im 1/15]
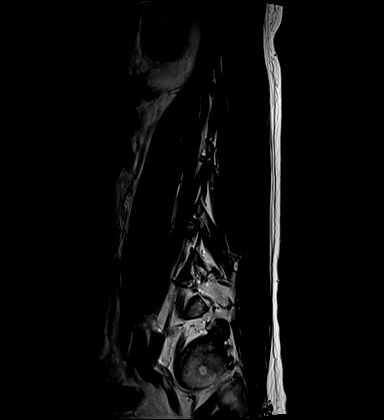
[im 3/15]
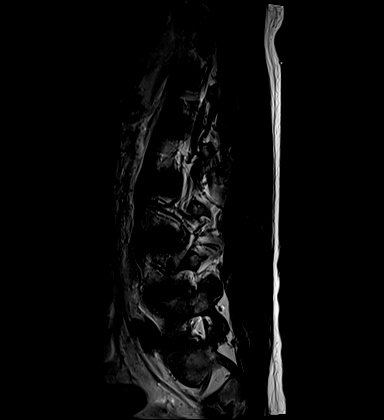
[im 6/15]
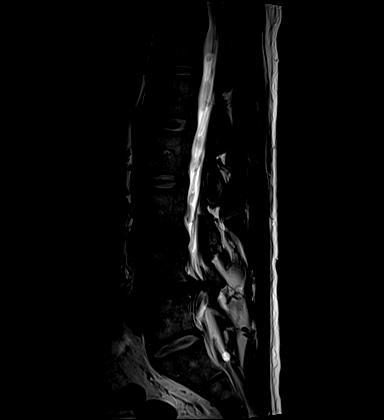
[im 9/15]
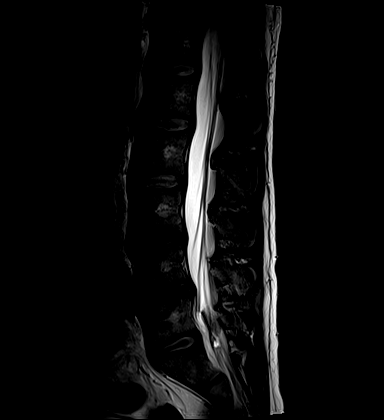
[im 12/15]
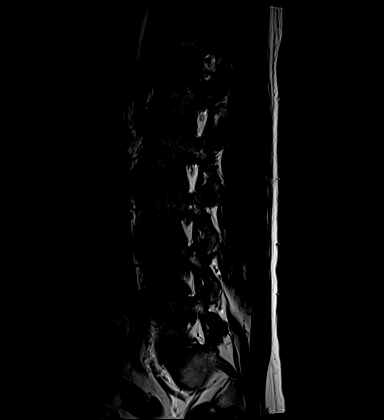
[im 15/15]
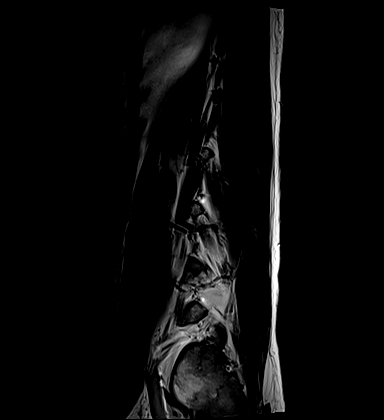

[Series 7: T1 · sagittal · 4.0mm · 0.73mm/px · 3 of 15 slices shown (1 of 2)]
[im 1/15]
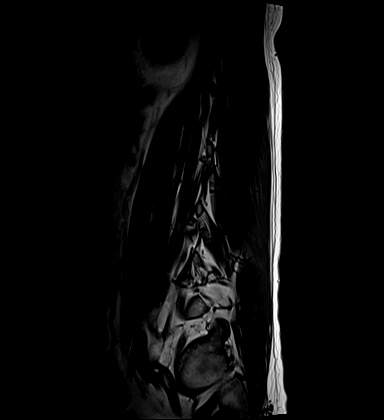
[im 8/15]
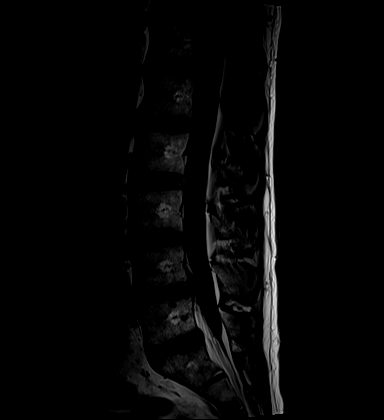
[im 15/15]
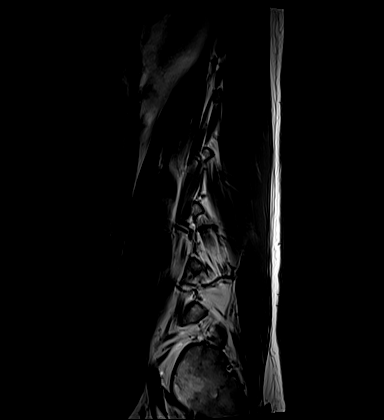

[Series 10: T1 · axial · 4.0mm · 0.28mm/px · z∈[-86,+104]mm · 3 of 45 slices shown (2 of 2)]
[im 6/45]
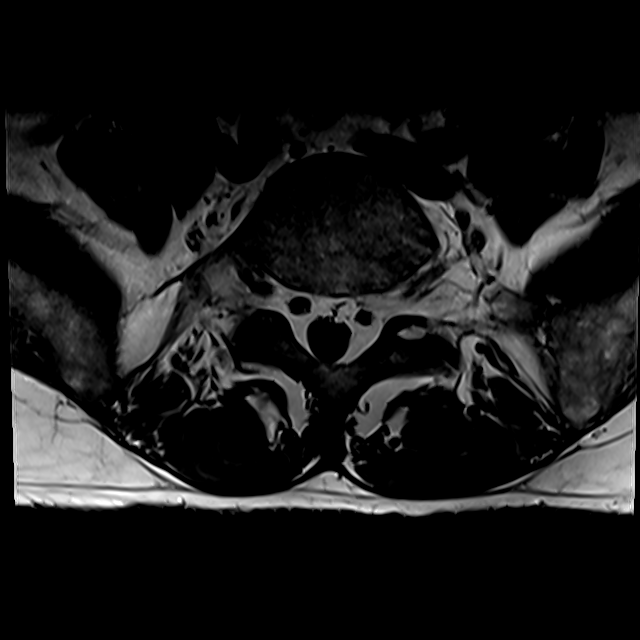
[im 24/45]
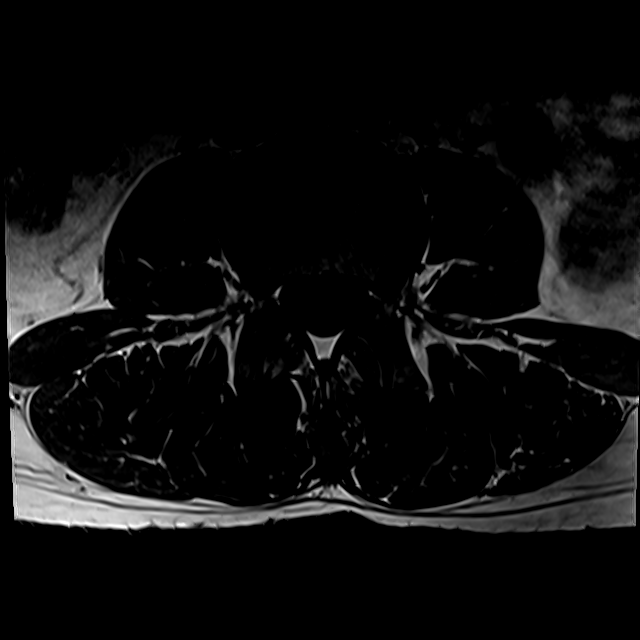
[im 39/45]
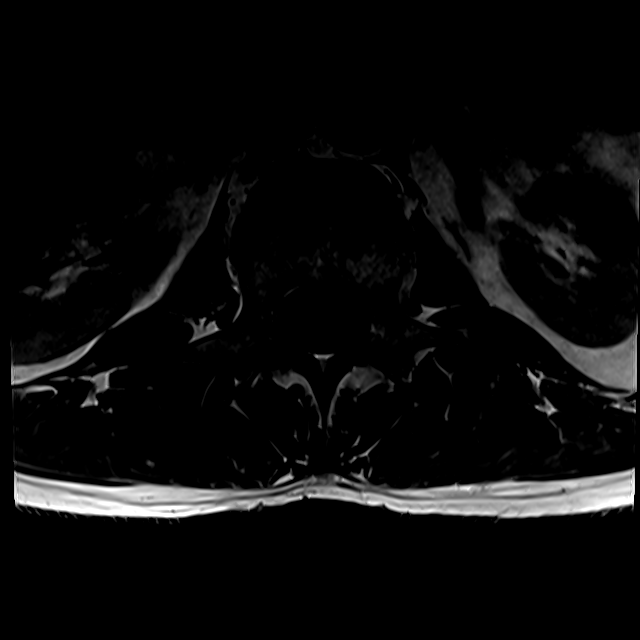

[Series 13: T2 · axial · 4.0mm · 0.28mm/px · z∈[-101,+104]mm · 5 of 45 slices shown (2 of 2)]
[im 3/45]
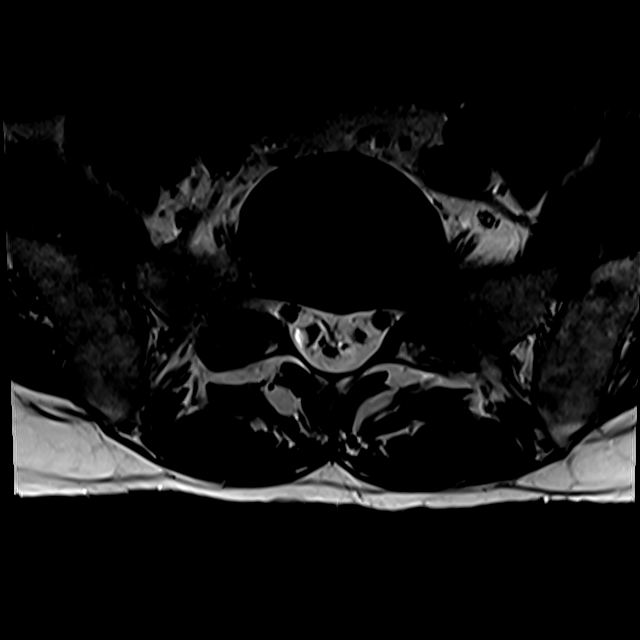
[im 6/45]
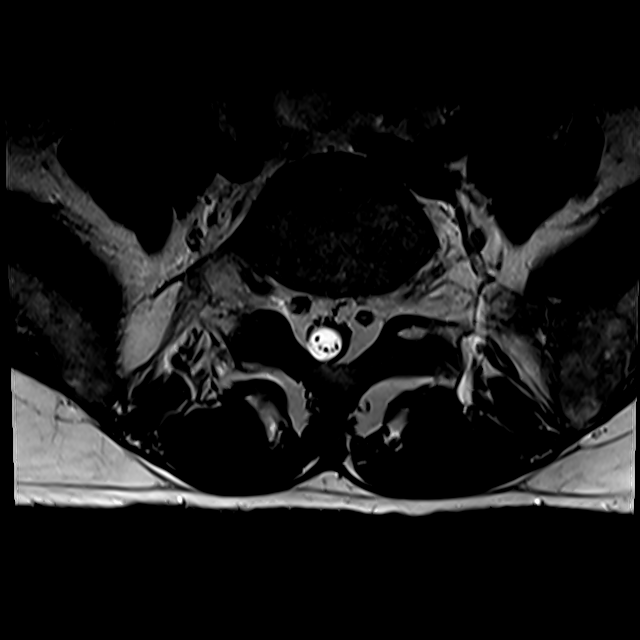
[im 9/45]
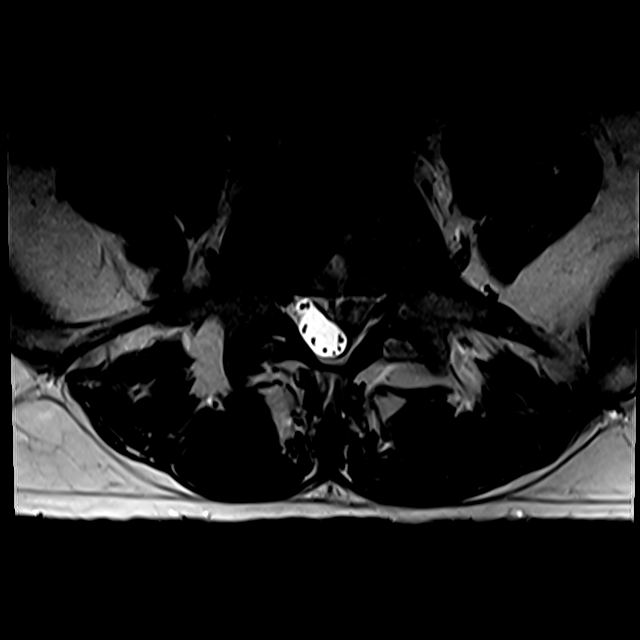
[im 24/45]
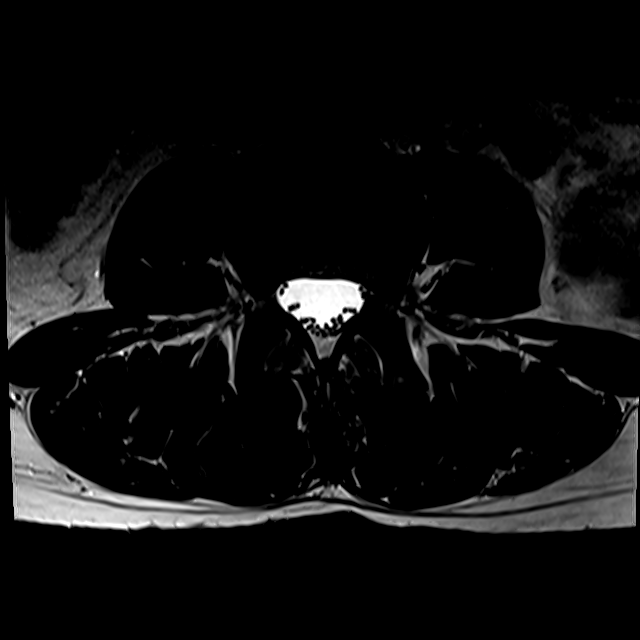
[im 39/45]
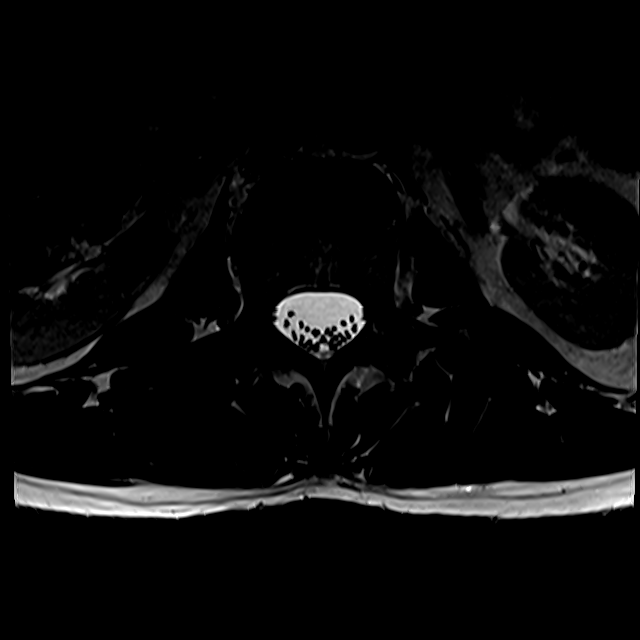

[17 of 48 positions shown; findings below may reference images not displayed]

FINDINGS: Segmentation: Assuming hypoplastic ribs at T12 and full size ribs at
T11, as seen on the comparison radiographs, then lumbar segmentation
is normal.

Alignment: Increase straightening of lumbar lordosis compared to the
[facility] radiographs. Subtle anterolisthesis of L5 on S1 is stable.

Vertebrae: Mild degenerative appearing posterior endplate marrow
edema at L4-L5, see additional details below. Background bone marrow
signal is within normal limits. Superimposed small chronic L5
superior endplate Schmorl's node. Trace degenerative end plate
marrow edema inferiorly at L3. Probable small benign hemangiomas in
the posterior L5 vertebra and the left S1 sacral ala, demonstrating
intermediate T1 signal but a stippled T2 appearance.

Conus medullaris and cauda equina: Conus extends to the T12-L1
level. No lower spinal cord or conus signal abnormality. Capacious
spinal canal and thecal sac.

Paraspinal and other soft tissues: Small benign renal cysts.
Otherwise negative.

Disc levels:

T11-T12: Mild disc desiccation and small left paracentral disc
bulge. No stenosis.

T12-L1:  Negative.

L1-L2:  Negative.

L2-L3:  Mild left facet hypertrophy.

L3-L4: Mild disc desiccation and disc space loss with left foraminal
disc bulging. Borderline to mild left foraminal to extraforaminal
stenosis (series 13, image 27).

L4-L5: Disc desiccation with large and complex leftward disc
extrusion (series 13, image 34). Intermediate T1 and T2 signal
space-occupying lesion in the left lateral recess continues into the
left ventral epidural space of L5 compatible with a sequestered disc
fragment +/-epidural blood products encompassing 10 x 15 x 18
millimeters (AP by transverse by CC). At the disc space level there
is a broad-based left subarticular extrusion which continues toward
the left neural foramen. The right lateral recess is largely spared.
Severe left lateral recess stenosis with mild spinal stenosis.
Moderate left L4 foraminal stenosis.

L5-S1:  Epidural lipomatosis.  Otherwise negative.
IMPRESSION: 1. Large and complex leftward disc extrusion at L4-L5 with a caudal
sequestered disc fragment +/- epidural blood products encompassing
up to 18 mm. Severe left lateral recess stenosis at the left L5
nerve level. Moderate neural foraminal stenosis at the left L4 nerve
level. Mild spinal stenosis at the left S1 nerve level.
2. Comparatively mild disc degeneration at L3-L4 affecting the left
neural foramen. Mild disc degeneration at T11-T12.
3. Probable small benign osseous hemangiomas in the L5 and S1
vertebrae. Consider a post treatment follow-up MRI to document
stability.

## 2023-06-23 ENCOUNTER — Other Ambulatory Visit (HOSPITAL_BASED_OUTPATIENT_CLINIC_OR_DEPARTMENT_OTHER): Payer: Self-pay | Admitting: Family Medicine

## 2023-06-23 DIAGNOSIS — E785 Hyperlipidemia, unspecified: Secondary | ICD-10-CM

## 2023-07-03 ENCOUNTER — Ambulatory Visit (HOSPITAL_COMMUNITY)
Admission: RE | Admit: 2023-07-03 | Discharge: 2023-07-03 | Disposition: A | Discharge status: Home / Self Care | Payer: BLUE CROSS/BLUE SHIELD | Source: Ambulatory Visit | Attending: Family Medicine | Admitting: Family Medicine

## 2023-07-03 DIAGNOSIS — E785 Hyperlipidemia, unspecified: Secondary | ICD-10-CM | POA: Insufficient documentation

## 2024-07-20 ENCOUNTER — Emergency Department (HOSPITAL_BASED_OUTPATIENT_CLINIC_OR_DEPARTMENT_OTHER)

## 2024-07-20 ENCOUNTER — Other Ambulatory Visit: Payer: Self-pay

## 2024-07-20 ENCOUNTER — Emergency Department (HOSPITAL_BASED_OUTPATIENT_CLINIC_OR_DEPARTMENT_OTHER)
Admission: EM | Admit: 2024-07-20 | Discharge: 2024-07-20 | Disposition: A | Discharge status: Home / Self Care | Attending: Emergency Medicine | Admitting: Emergency Medicine

## 2024-07-20 ENCOUNTER — Encounter (HOSPITAL_BASED_OUTPATIENT_CLINIC_OR_DEPARTMENT_OTHER): Payer: Self-pay | Admitting: Emergency Medicine

## 2024-07-20 DIAGNOSIS — M5442 Lumbago with sciatica, left side: Secondary | ICD-10-CM | POA: Diagnosis not present

## 2024-07-20 DIAGNOSIS — M545 Low back pain, unspecified: Secondary | ICD-10-CM | POA: Diagnosis present

## 2024-07-20 DIAGNOSIS — M5432 Sciatica, left side: Secondary | ICD-10-CM

## 2024-07-20 MED ORDER — HYDROCODONE-ACETAMINOPHEN 5-325 MG PO TABS
2.0000 | ORAL_TABLET | Freq: Once | ORAL | Status: AC
  Administered 2024-07-20: 2 via ORAL
  Filled 2024-07-20: qty 2

## 2024-07-20 MED ORDER — NAPROXEN 375 MG PO TABS: 375.0000 mg | ORAL_TABLET | Freq: Two times a day (BID) | ORAL | 0 refills | Status: AC

## 2024-07-20 MED ORDER — LIDOCAINE 5 % EX PTCH: 1.0000 | MEDICATED_PATCH | CUTANEOUS | 0 refills | Status: AC

## 2024-07-20 MED ORDER — HYDROCODONE-ACETAMINOPHEN 5-325 MG PO TABS: 1.0000 | ORAL_TABLET | ORAL | 0 refills | Status: AC | PRN

## 2024-07-20 MED ORDER — DEXAMETHASONE SOD PHOSPHATE PF 10 MG/ML IJ SOLN
10.0000 mg | Freq: Once | INTRAMUSCULAR | Status: AC
  Administered 2024-07-20: 10 mg via INTRAMUSCULAR

## 2024-07-20 NOTE — ED Provider Notes (Signed)
 [place] EMERGENCY DEPARTMENT AT [facility] Provider Note   CSN: [id number] Arrival date & time: 07/20/24  1747     Patient presents with: Back Pain and Leg Pain   Evan Schwartz is a 58 y.o. male.   Patient is a 58 year old male who presents with back pain.  He has a history of a prior herniated disc.  This was in 2019.  He said it got better with physical therapy.  He recently has been moving in the process of moving.  His back hurt a little bit last week but he went to pick up a box today and noticed a pain in his left lower back/buttocks area that radiates down his left leg.  He says when he moves or turns a certain way it shoots down his left leg.  He has some intermittent tingling in the left leg but no persistent numbness.  No weakness in the leg.  No loss of bowel or bladder control.  No fevers.       Prior to Admission medications   Medication Sig Start Date End Date Taking? Authorizing Provider  HYDROcodone -acetaminophen  (NORCO/VICODIN) 5-325 MG tablet Take 1-2 tablets by mouth every 4 (four) hours as needed. 07/20/24  Yes Lenor Hollering, MD  lidocaine  (LIDODERM ) 5 % Place 1 patch onto the skin daily. Remove & Discard patch within 12 hours or as directed by MD 07/20/24  Yes Lenor Hollering, MD  naproxen  (NAPROSYN ) 375 MG tablet Take 1 tablet (375 mg total) by mouth 2 (two) times daily. 07/20/24  Yes Lenor Hollering, MD  cyclobenzaprine  (FLEXERIL ) 10 MG tablet Take one tab po bid prn muscle spasm 03/31/18   Jule Ronal CROME, PA-C  Diclofenac  18 MG CAPS Take 18 mg by mouth 3 (three) times daily. Patient not taking: Reported on 03/31/2018 03/23/18   Zada Elouise BROCKS, PA-C  elvitegravir-cobicistat-emtricitabine-tenofovir (GENVOYA ) 150-150-200-10 MG TABS tablet Take 1 tablet by mouth daily with breakfast. 08/28/18   Khatri, Hina, PA-C  ibuprofen  (ADVIL ,MOTRIN ) 200 MG tablet Take 200 mg by mouth every 6 (six) hours as needed for moderate pain.    [provider]   methocarbamol  (ROBAXIN ) 500 MG tablet Take 1 tablet (500 mg total) by mouth 2 (two) times daily. Patient not taking: Reported on 03/31/2018 03/23/18   Joy, Elouise BROCKS, PA-C  predniSONE  (STERAPRED UNI-PAK 21 TAB) 10 MG (21) TBPK tablet Take 6 tabs (60mg ) on day 1, 5 tabs (50mg ) on day 2, 4 tabs (40mg ) on day 3, 3 tabs (30mg ) on day 4, 2 tabs (20mg ) on day 5, and 1 tab (10mg ) on day 6. Patient not taking: Reported on 03/31/2018 03/23/18   Zada Elouise BROCKS, PA-C    Allergies: Penicillins    Review of Systems  Constitutional:  Negative for fever.  Gastrointestinal:  Negative for nausea and vomiting.  Musculoskeletal:  Positive for back pain. Negative for arthralgias, joint swelling and neck pain.  Skin:  Negative for wound.  Neurological:  Positive for numbness (Intermittent tingling). Negative for weakness and headaches.    Updated Vital Signs BP (!) 149/98 (BP Location: Right Arm)   Pulse 72   Temp 97.7 F (36.5 C) (Oral)   Resp 18   SpO2 100%   Physical Exam Constitutional:      Appearance: He is well-developed.  HENT:     Head: Normocephalic and atraumatic.  Cardiovascular:     Rate and Rhythm: Normal rate.  Pulmonary:     Effort: Pulmonary effort is normal.  Musculoskeletal:  General: Tenderness present.     Cervical back: Normal range of motion and neck supple.     Comments: Positive tenderness in the left lower lumbar back.  Positive straight leg raise on the left.  Patellar reflexes symmetric bilaterally.  He has normal sensation to light touch in the lower extremities bilaterally.  Normal strength in the lower extremities bilaterally.  Pedal pulses are intact.  Skin:    General: Skin is warm and dry.  Neurological:     Mental Status: He is alert and oriented to person, place, and time.     (all labs ordered are listed, but only abnormal results are displayed) Labs Reviewed - No data to display  EKG: None  Radiology: DG Lumbar Spine Complete Result Date:  07/20/2024 CLINICAL DATA:  Low back and left leg pain after bending over and picking up box. EXAM: LUMBAR SPINE - COMPLETE 4+ VIEW COMPARISON:  MRI lumbar spine from 2019 FINDINGS: Normal alignment of the lumbar vertebral bodies. Disc spaces and vertebral bodies are maintained. The facets are normally aligned. No pars defects. The visualized bony pelvis is intact. IMPRESSION: Normal alignment and no acute bony findings or significant degenerative changes. Electronically Signed   By: MYRTIS Stammer M.D.   On: 07/20/2024 18:48     Procedures   Medications Ordered in the ED  dexamethasone (DECADRON) injection 10 mg (has no administration in time range)  HYDROcodone -acetaminophen  (NORCO/VICODIN) 5-325 MG per tablet 2 tablet (has no administration in time range)                                    Medical Decision Making Amount and/or Complexity of Data Reviewed Radiology: ordered.  Risk Prescription drug management.   Patient is a 58 year old who presents with pain in his left lower back, radiating down his left leg.  He has intermittent tingling's but no persistent numbness or weakness in the leg.  No other suggestions of cauda equina syndrome.  No fevers or other concerns for infection.  X-rays show normal bony structure.  This was interpreted by me confirmed by the radiologist.  He was given a shot of Decadron as well as a dose of Vicodin here in the ED.  He was discharged home in good condition.  Will give him a prescription for Lidoderm  patches as well as a short course of Vicodin and Naprosyn.  Encouraged him to have close follow-up with his orthopedic surgeon.  Return precautions were given.     Final diagnoses:  Sciatica of left side    ED Discharge Orders          Ordered    HYDROcodone -acetaminophen  (NORCO/VICODIN) 5-325 MG tablet  Every 4 hours PRN        07/20/24 2014    lidocaine  (LIDODERM ) 5 %  Every 24 hours        07/20/24 2014    naproxen (NAPROSYN) 375 MG tablet  2  times daily        07/20/24 2014               Lenor Hollering, MD 07/20/24 2021

## 2024-07-20 NOTE — ED Triage Notes (Signed)
 Pt presents with hx of lumbar disc herniation.  Pt reached down for an empty box today and had onset of left leg numbness and lower back pain.

## 2024-07-20 NOTE — Discharge Instructions (Addendum)
 Make an appointment to follow-up with your orthopedist.  Return to the emergency room if you have any worsening symptoms.
